# Patient Record
Sex: Female | Born: 1975 | Race: Black or African American | Hispanic: No | Marital: Married | Smoking: Current some day smoker
Health system: Southern US, Community
[De-identification: ages and names within clinical notes are randomized; demographics above are authoritative.]

## PROBLEM LIST (undated history)

## (undated) ENCOUNTER — Emergency Department (HOSPITAL_COMMUNITY): Payer: Managed Care, Other (non HMO)

---

## 2000-06-30 ENCOUNTER — Encounter: Payer: Self-pay | Admitting: Internal Medicine

## 2000-06-30 ENCOUNTER — Emergency Department (HOSPITAL_COMMUNITY): Admission: EM | Admit: 2000-06-30 | Discharge: 2000-06-30 | Payer: Self-pay | Admitting: Internal Medicine

## 2000-11-10 ENCOUNTER — Emergency Department (HOSPITAL_COMMUNITY): Admission: EM | Admit: 2000-11-10 | Discharge: 2000-11-10 | Payer: Self-pay

## 2004-03-03 ENCOUNTER — Emergency Department (HOSPITAL_COMMUNITY): Admission: EM | Admit: 2004-03-03 | Discharge: 2004-03-03 | Payer: Self-pay | Admitting: Emergency Medicine

## 2005-03-25 ENCOUNTER — Emergency Department (HOSPITAL_COMMUNITY): Admission: EM | Admit: 2005-03-25 | Discharge: 2005-03-25 | Payer: Self-pay | Admitting: Emergency Medicine

## 2011-11-29 ENCOUNTER — Emergency Department (HOSPITAL_COMMUNITY)
Admission: EM | Admit: 2011-11-29 | Discharge: 2011-11-29 | Discharge status: Against Medical Advice | Payer: Managed Care, Other (non HMO) | Attending: Emergency Medicine | Admitting: Emergency Medicine

## 2011-11-29 DIAGNOSIS — R350 Frequency of micturition: Secondary | ICD-10-CM | POA: Insufficient documentation

## 2011-11-29 LAB — URINALYSIS, ROUTINE W REFLEX MICROSCOPIC
Bilirubin Urine: NEGATIVE
Glucose, UA: NEGATIVE mg/dL
Ketones, ur: NEGATIVE mg/dL
Leukocytes, UA: NEGATIVE
Nitrite: NEGATIVE
Protein, ur: NEGATIVE mg/dL
Specific Gravity, Urine: 1.007 (ref 1.005–1.030)
Urobilinogen, UA: 0.2 mg/dL (ref 0.0–1.0)
pH: 6 (ref 5.0–8.0)

## 2011-11-29 LAB — URINE MICROSCOPIC-ADD ON

## 2011-11-29 LAB — POCT PREGNANCY, URINE: Preg Test, Ur: NEGATIVE

## 2011-11-29 NOTE — ED Notes (Signed)
Pt in from home with stated frequent urination with discharge x1 week pt also states odor states some lower abd cramping with pain to the back denies n/v/d

## 2012-08-02 ENCOUNTER — Encounter (HOSPITAL_COMMUNITY): Payer: Self-pay | Admitting: Emergency Medicine

## 2012-08-02 ENCOUNTER — Emergency Department (HOSPITAL_COMMUNITY)
Admission: EM | Admit: 2012-08-02 | Discharge: 2012-08-03 | Disposition: A | Discharge status: Home / Self Care | Payer: Managed Care, Other (non HMO) | Attending: Emergency Medicine | Admitting: Emergency Medicine

## 2012-08-02 DIAGNOSIS — N12 Tubulo-interstitial nephritis, not specified as acute or chronic: Secondary | ICD-10-CM

## 2012-08-02 DIAGNOSIS — F17200 Nicotine dependence, unspecified, uncomplicated: Secondary | ICD-10-CM | POA: Insufficient documentation

## 2012-08-02 LAB — URINALYSIS, ROUTINE W REFLEX MICROSCOPIC
Nitrite: NEGATIVE
Specific Gravity, Urine: 1.009 (ref 1.005–1.030)
pH: 6.5 (ref 5.0–8.0)

## 2012-08-02 LAB — CBC WITH DIFFERENTIAL/PLATELET
Basophils Absolute: 0 10*3/uL (ref 0.0–0.1)
HCT: 35.7 % — ABNORMAL LOW (ref 36.0–46.0)
Hemoglobin: 12.3 g/dL (ref 12.0–15.0)
Lymphocytes Relative: 19 % (ref 12–46)
Monocytes Absolute: 0.6 10*3/uL (ref 0.1–1.0)
Monocytes Relative: 9 % (ref 3–12)
Neutro Abs: 4.9 10*3/uL (ref 1.7–7.7)
RDW: 13 % (ref 11.5–15.5)
WBC: 7 10*3/uL (ref 4.0–10.5)

## 2012-08-02 LAB — URINE MICROSCOPIC-ADD ON

## 2012-08-02 MED ORDER — FENTANYL CITRATE 0.05 MG/ML IJ SOLN
50.0000 ug | Freq: Once | INTRAMUSCULAR | Status: AC
  Administered 2012-08-02: 50 ug via INTRAVENOUS
  Filled 2012-08-02: qty 2

## 2012-08-02 MED ORDER — ONDANSETRON HCL 4 MG/2ML IJ SOLN
4.0000 mg | Freq: Once | INTRAMUSCULAR | Status: AC
  Administered 2012-08-02: 4 mg via INTRAVENOUS
  Filled 2012-08-02: qty 2

## 2012-08-02 MED ORDER — SODIUM CHLORIDE 0.9 % IV BOLUS (SEPSIS)
1000.0000 mL | Freq: Once | INTRAVENOUS | Status: AC
  Administered 2012-08-02: 1000 mL via INTRAVENOUS

## 2012-08-02 NOTE — ED Notes (Signed)
Pt c/o lt side pain with burning on urination x3days ago with nausea, no vomiting

## 2012-08-02 NOTE — ED Notes (Signed)
Triage assessment done by Diane,RN

## 2012-08-03 LAB — COMPREHENSIVE METABOLIC PANEL
BUN: 13 mg/dL (ref 6–23)
Calcium: 9.9 mg/dL (ref 8.4–10.5)
Creatinine, Ser: 0.79 mg/dL (ref 0.50–1.10)
GFR calc Af Amer: >90 mL/min (ref >90)
Glucose, Bld: 104 mg/dL — ABNORMAL HIGH (ref 70–99)
Total Protein: 7.3 g/dL (ref 6.0–8.3)

## 2012-08-03 MED ORDER — ONDANSETRON HCL 4 MG/2ML IJ SOLN
4.0000 mg | Freq: Once | INTRAMUSCULAR | Status: AC
  Administered 2012-08-03: 4 mg via INTRAVENOUS

## 2012-08-03 MED ORDER — FENTANYL CITRATE 0.05 MG/ML IJ SOLN
50.0000 ug | Freq: Once | INTRAMUSCULAR | Status: AC
  Administered 2012-08-03: 50 ug via INTRAVENOUS

## 2012-08-03 MED ORDER — ONDANSETRON HCL 4 MG/2ML IJ SOLN
INTRAMUSCULAR | Status: AC
  Administered 2012-08-03: 4 mg via INTRAVENOUS
  Filled 2012-08-03: qty 2

## 2012-08-03 MED ORDER — ONDANSETRON HCL 4 MG PO TABS: 4.0000 mg | ORAL_TABLET | Freq: Four times a day (QID) | ORAL | Status: AC

## 2012-08-03 MED ORDER — CIPROFLOXACIN IN D5W 400 MG/200ML IV SOLN
INTRAVENOUS | Status: AC
  Administered 2012-08-03: 400 mg via INTRAVENOUS
  Filled 2012-08-03: qty 200

## 2012-08-03 MED ORDER — OXYCODONE-ACETAMINOPHEN 5-325 MG PO TABS: 1.0000 | ORAL_TABLET | Freq: Four times a day (QID) | ORAL | Status: AC | PRN

## 2012-08-03 MED ORDER — CIPROFLOXACIN HCL 500 MG PO TABS: 500.0000 mg | ORAL_TABLET | Freq: Two times a day (BID) | ORAL | Status: AC

## 2012-08-03 MED ORDER — FENTANYL CITRATE 0.05 MG/ML IJ SOLN
INTRAMUSCULAR | Status: AC
  Administered 2012-08-03: 50 ug via INTRAVENOUS
  Filled 2012-08-03: qty 2

## 2012-08-03 MED ORDER — CIPROFLOXACIN IN D5W 400 MG/200ML IV SOLN
400.0000 mg | Freq: Once | INTRAVENOUS | Status: AC
  Administered 2012-08-03: 400 mg via INTRAVENOUS

## 2012-08-03 NOTE — ED Provider Notes (Signed)
History     CSN: [id number]  Arrival date & time 08/02/12  2003   First MD Initiated Contact with Patient 08/02/12 2253      Chief Complaint  Patient presents with  . Flank Pain    (Consider location/radiation/quality/duration/timing/severity/associated sxs/prior treatment) HPI  Patient to the ER for evaluation of back pain with burning on urination for 3 days. She has had some nausea without vomiting. No fevers, chills, diarrhea, or hematuria. She rates the pain as a 5/10 and the quality is a "heavy dullness". NAD/VSS  History reviewed. No pertinent past medical history.  History reviewed. No pertinent past surgical history.  No family history on file.  History  Substance Use Topics  . Smoking status: Current Some Day Smoker    Types: Cigars  . Smokeless tobacco: Not on file  . Alcohol Use: Yes    OB History    Grav Para Term Preterm Abortions TAB SAB Ect Mult Living                  Review of Systems   Review of Systems  Gen: no weight loss, fevers, chills, night sweats  Eyes: no discharge or drainage, no occular pain or visual changes  Nose: no epistaxis or rhinorrhea  Mouth: no dental pain, no sore throat  Neck: no neck pain  Lungs:No wheezing, coughing or hemoptysis CV: no chest pain, palpitations, dependent edema or orthopnea  Abd: no abdominal pain, + nausea, - vomiting  GU: + dysuria or gross hematuria  MSK:  No abnormalities  Neuro: no headache, no focal neurologic deficits  Skin: no abnormalities Psyche: negative.    Allergies  Codeine  Home Medications   Current Outpatient Rx  Name Route Sig Dispense Refill  . ADULT MULTIVITAMIN W/MINERALS CH Oral Take 1 tablet by mouth 2 (two) times daily.    Marland Kitchen CIPROFLOXACIN HCL 500 MG PO TABS Oral Take 1 tablet (500 mg total) by mouth 2 (two) times daily. 14 tablet 0  . ONDANSETRON HCL 4 MG PO TABS Oral Take 1 tablet (4 mg total) by mouth every 6 (six) hours. 12 tablet 0  . OXYCODONE-ACETAMINOPHEN  5-325 MG PO TABS Oral Take 1 tablet by mouth every 6 (six) hours as needed for pain. 15 tablet 0    BP 123/89  Pulse 84  Temp 98.4 F (36.9 C)  Resp 18  Wt 115 lb (52.164 kg)  SpO2 100%  LMP 07/24/2012  Physical Exam  Constitutional: She appears well-developed and well-nourished.  HENT:  Head: Normocephalic and atraumatic.  Eyes: Pupils are equal, round, and reactive to light.  Neck: Trachea normal, normal range of motion and full passive range of motion without pain. Neck supple.  Cardiovascular: Normal rate, regular rhythm, normal heart sounds and normal pulses.   Pulmonary/Chest: Effort normal and breath sounds normal. Chest wall is not dull to percussion. She exhibits no crepitus, no edema, no deformity and no retraction.  Abdominal: Normal appearance. There is generalized tenderness (diffuse generalized back pain). There is no rigidity, no rebound, no guarding, no CVA tenderness, no tenderness at McBurney's point and negative Murphy's sign.  Musculoskeletal: Normal range of motion.  Neurological: She is alert. She has normal strength.  Skin: Skin is warm, dry and intact. She is not diaphoretic.  Psychiatric: She has a normal mood and affect. Her speech is normal. Cognition and memory are normal.    ED Course  Procedures (including critical care time)  Labs Reviewed  URINALYSIS, ROUTINE W REFLEX MICROSCOPIC -  Abnormal; Notable for the following:    APPearance CLOUDY (*)     Hgb urine dipstick SMALL (*)     Leukocytes, UA LARGE (*)     All other components within normal limits  URINE MICROSCOPIC-ADD ON - Abnormal; Notable for the following:    Bacteria, UA FEW (*)     All other components within normal limits  COMPREHENSIVE METABOLIC PANEL - Abnormal; Notable for the following:    Glucose, Bld 104 (*)     Total Bilirubin 0.2 (*)     All other components within normal limits  CBC WITH DIFFERENTIAL - Abnormal; Notable for the following:    HCT 35.7 (*)     All other  components within normal limits  PREGNANCY, URINE  URINE CULTURE  LAB REPORT - SCANNED   No results found.   1. Pyelonephritis       MDM  Pt given IV Cipro and pain medication with zofran. Dc with Cipro, pain meds and nausea medication and asked to follow-up with her PCP. The patient feeling much better prior to discharge. Her labs, PE and vitals support that she is well enough for OP treatment.  Pt has been advised of the symptoms that warrant their return to the ED. Patient has voiced understanding and has agreed to follow-up with the PCP or specialist.         Dorthula Matas, PA 08/11/12 [phone]

## 2012-08-06 LAB — URINE CULTURE: Colony Count: 50000

## 2012-08-07 NOTE — ED Notes (Signed)
+  Urine. Patient treated with Cipro. Sensitive to same. Per protocol MD. °

## 2012-08-11 NOTE — ED Provider Notes (Signed)
Medical screening examination/treatment/procedure(s) were performed by non-physician practitioner and as supervising physician I was immediately available for consultation/collaboration.   Richardean Canal, MD 08/11/12 [phone]

## 2013-10-24 ENCOUNTER — Ambulatory Visit: Payer: Managed Care, Other (non HMO) | Admitting: Family Medicine

## 2013-10-24 VITALS — BP 98/60 | HR 70 | Temp 98.6°F | Resp 18 | Ht 64.0 in | Wt 117.0 lb

## 2013-10-24 DIAGNOSIS — R5383 Other fatigue: Secondary | ICD-10-CM

## 2013-10-24 DIAGNOSIS — R001 Bradycardia, unspecified: Secondary | ICD-10-CM

## 2013-10-24 DIAGNOSIS — R5381 Other malaise: Secondary | ICD-10-CM

## 2013-10-24 DIAGNOSIS — R350 Frequency of micturition: Secondary | ICD-10-CM

## 2013-10-24 DIAGNOSIS — I498 Other specified cardiac arrhythmias: Secondary | ICD-10-CM

## 2013-10-24 DIAGNOSIS — R110 Nausea: Secondary | ICD-10-CM

## 2013-10-24 LAB — POCT UA - MICROSCOPIC ONLY
Bacteria, U Microscopic: NEGATIVE
Casts, Ur, LPF, POC: NEGATIVE
Crystals, Ur, HPF, POC: NEGATIVE
Mucus, UA: NEGATIVE

## 2013-10-24 LAB — POCT CBC
Granulocyte percent: 63.2 %G (ref 37–80)
HCT, POC: 44.4 % (ref 37.7–47.9)
Hemoglobin: 13.7 g/dL (ref 12.2–16.2)
POC Granulocyte: 3.6 (ref 2–6.9)
RBC: 4.98 M/uL (ref 4.04–5.48)

## 2013-10-24 LAB — POCT URINALYSIS DIPSTICK
Bilirubin, UA: NEGATIVE
Nitrite, UA: NEGATIVE
pH, UA: 6.5

## 2013-10-24 LAB — POCT URINE PREGNANCY: Preg Test, Ur: NEGATIVE

## 2013-10-24 LAB — GLUCOSE, POCT (MANUAL RESULT ENTRY): POC Glucose: 93 mg/dl (ref 70–99)

## 2013-10-24 MED ORDER — RANITIDINE HCL 150 MG PO TABS: 150.0000 mg | ORAL_TABLET | Freq: Two times a day (BID) | ORAL | Status: DC

## 2013-10-24 MED ORDER — ONDANSETRON 8 MG PO TBDP: 8.0000 mg | ORAL_TABLET | Freq: Three times a day (TID) | ORAL | Status: DC | PRN

## 2013-10-24 NOTE — Progress Notes (Signed)
Subjective:  This chart was scribed for Norberto Sorenson, MD by Ronal Fear, ED Scribe. the patient's care was started at 5:10 PM.  Chief Complaint  Patient presents with  . Nausea    x1 week   . Fatigue  . Urinary Frequency     Patient ID: Amanda Goodman, female    DOB: 04/20/1976, 37 y.o.   MRN: [id number]  HPI HPI Comments: Amanda Goodman is a 37 y.o. female who presents to [facility] complaining of nausea accompanied a sensation of regurgitation into her throat without any actual emesis for the past 4 days. Has also had a little epigastric discomfort. She states that it feels like her food is stuck in her throat. She has also been very fatigued. She denies fever, chills, sweats, CP.  She also complains of increased urination.  Pt has also had nocturia of at least 2-3x a night for the past sev d which is unusual for her.  She also complains of sudden onset diarrhea today that started this morning and lasted just a few hours with just a few loose watery stools.  She denies bloody or tarry stools. She has not been taking any OTC meds/products. Pt has had a decreased appetite over the past few days. When she does eat, she eats normal meals but has been skipping lots of meals.  Nothing that she eats exacerbates her sxs.   Pt is sexually active and not using birth control.  Se denies breast tenderness. Pt's periods have been normal, until the last 2 mos when they were a little delayed and lighter.   Pt's last pap smear was 1x year ago.     History reviewed. No pertinent past medical history. 5:10 PM-Ordered 25 mg benadryl, 20 mg Pepcid and 60 mg prednisone  Current Outpatient Prescriptions on File Prior to Visit  Medication Sig Dispense Refill  . Multiple Vitamin (MULTIVITAMIN WITH MINERALS) TABS Take 1 tablet by mouth 2 (two) times daily.       No current facility-administered medications on file prior to visit.     Review of Systems  Constitutional: Positive for appetite change and  fatigue. Negative for fever, chills, diaphoresis, activity change and unexpected weight change.  HENT: Positive for trouble swallowing. Negative for sore throat and voice change.   Respiratory: Positive for chest tightness. Negative for cough, shortness of breath and wheezing.   Cardiovascular: Negative for chest pain, palpitations and leg swelling.  Gastrointestinal: Positive for nausea, abdominal pain and diarrhea. Negative for vomiting, constipation, blood in stool, abdominal distention and anal bleeding.  Endocrine: Positive for polyuria. Negative for polydipsia and polyphagia.  Genitourinary: Positive for frequency. Negative for dysuria and difficulty urinating.  Musculoskeletal: Negative for back pain.  Skin: Negative for rash.  Allergic/Immunologic: Negative for food allergies and immunocompromised state.  Neurological: Negative for dizziness, seizures, syncope, facial asymmetry, weakness, light-headedness and numbness.  Hematological: Negative for adenopathy.  Psychiatric/Behavioral: Positive for sleep disturbance.      BP 98/60  Pulse 70  Temp(Src) 98.6 F (37 C) (Oral)  Resp 18  Ht 5\' 4"  (1.626 m)  Wt 117 lb (53.071 kg)  BMI 20.07 kg/m2  SpO2 100%  LMP 10/02/2013  Objective:   Physical Exam  Nursing note and vitals reviewed. Constitutional: She is oriented to person, place, and time. She appears well-developed and well-nourished.  HENT:  Head: Normocephalic and atraumatic.  Eyes: Conjunctivae and EOM are normal. Pupils are equal, round, and reactive to light.  Neck: Normal range of  motion. Neck supple. No thyromegaly present.  Cardiovascular: S1 normal, S2 normal and intact distal pulses.  A regularly irregular rhythm present. Frequent extrasystoles are present. Bradycardia present.   No murmur heard. Pulses:      Radial pulses are 2+ on the right side, and 2+ on the left side.  Pulmonary/Chest: Effort normal and breath sounds normal.  Abdominal: Soft. Normal  appearance and bowel sounds are normal. She exhibits no distension and no mass. There is no hepatosplenomegaly. There is no tenderness. There is no rebound, no guarding, no CVA tenderness and negative Murphy's sign. No hernia.  Musculoskeletal: Normal range of motion.  Lymphadenopathy:    She has no cervical adenopathy.  Neurological: She is alert and oriented to person, place, and time.  Skin: Skin is warm and dry.  Psychiatric: She has a normal mood and affect. Her behavior is normal.   EKG: ventricular bigeminy      Results for orders placed in visit on 10/24/13  POCT URINALYSIS DIPSTICK      Result Value Range   Color, UA yellow     Clarity, UA clear     Glucose, UA neg     Bilirubin, UA neg     Ketones, UA neg     Spec Grav, UA <=1.005     Blood, UA trace     pH, UA 6.5     Protein, UA neg     Urobilinogen, UA 0.2     Nitrite, UA neg     Leukocytes, UA Negative    POCT UA - MICROSCOPIC ONLY      Result Value Range   WBC, Ur, HPF, POC neg     RBC, urine, microscopic neg     Bacteria, U Microscopic neg     Mucus, UA neg     Epithelial cells, urine per micros 0-2     Crystals, Ur, HPF, POC neg     Casts, Ur, LPF, POC neg     Yeast, UA neg    POCT CBC      Result Value Range   WBC 5.7  4.6 - 10.2 K/uL   Lymph, poc 1.7  0.6 - 3.4   POC LYMPH PERCENT 29.5  10 - 50 %L   MID (cbc) 0.4  0 - 0.9   POC MID % 7.3  0 - 12 %M   POC Granulocyte 3.6  2 - 6.9   Granulocyte percent 63.2  37 - 80 %G   RBC 4.98  4.04 - 5.48 M/uL   Hemoglobin 13.7  12.2 - 16.2 g/dL   HCT, POC 14.7  82.9 - 47.9 %   MCV 89.1  80 - 97 fL   MCH, POC 27.5  27 - 31.2 pg   MCHC 30.9 (*) 31.8 - 35.4 g/dL   RDW, POC 56.2     Platelet Count, POC 228  142 - 424 K/uL   MPV 9.2  0 - 99.8 fL  GLUCOSE, POCT (MANUAL RESULT ENTRY)      Result Value Range   POC Glucose 93  70 - 99 mg/dl  POCT URINE PREGNANCY      Result Value Range   Preg Test, Ur Negative      Assessment & Plan:   Frequent urination -  Plan: POCT urinalysis dipstick, POCT UA - Microscopic Only, POCT glucose (manual entry), POCT urine pregnancy, Urine culture - UA nml but urine very dilute and pt w/ a h/o pyelonephritis so will send off for  clx to ensure not overlooking infection.  Nausea alone - Plan: POCT urinalysis dipstick, POCT UA - Microscopic Only, POCT CBC, TSH, Comprehensive metabolic panel - try bid ranitidine due to gastritis, reflux and dysphagia sxs. If sxs persist, may also try zofran just for symptomatic relief.  Fatigue - Plan: POCT urinalysis dipstick, POCT UA - Microscopic Only, POCT CBC, POCT glucose (manual entry), POCT urine pregnancy, TSH, Comprehensive metabolic panel, Ambulatory referral to Cardiology  Bradycardia - Plan: EKG 12-Lead, POCT glucose (manual entry), TSH, Comprehensive metabolic panel, Ambulatory referral to Cardiology  Ventricular bigeminy - Plan: Ambulatory referral to Cardiology -  Pt was informed about her abnormal cardiac rhythm of bradycardia with consistent ectopy which may be contributing to her fatigue. She will be given a referral to a cardiologist for more extensive eval inc to r/o any underlying structural cause.   Meds ordered this encounter  Medications  . ranitidine (ZANTAC) 150 MG tablet    Sig: Take 1 tablet (150 mg total) by mouth 2 (two) times daily.    Dispense:  60 tablet    Refill:  0  . ondansetron (ZOFRAN-ODT) 8 MG disintegrating tablet    Sig: Take 1 tablet (8 mg total) by mouth every 8 (eight) hours as needed for nausea.    Dispense:  10 tablet    Refill:  0    I personally performed the services described in this documentation, which was scribed in my presence. The recorded information has been reviewed and considered, and addended by me as needed.  Norberto Sorenson, MD MPH

## 2013-10-25 ENCOUNTER — Encounter: Payer: Self-pay | Admitting: Family Medicine

## 2013-10-25 LAB — TSH: TSH: 2.512 u[IU]/mL (ref 0.350–4.500)

## 2013-10-25 LAB — COMPREHENSIVE METABOLIC PANEL
ALT: 11 U/L (ref 0–35)
AST: 16 U/L (ref 0–37)
BUN: 11 mg/dL (ref 6–23)
Creat: 0.89 mg/dL (ref 0.50–1.10)
Total Bilirubin: 0.7 mg/dL (ref 0.3–1.2)

## 2013-10-25 LAB — URINE CULTURE: Organism ID, Bacteria: NO GROWTH

## 2015-09-20 ENCOUNTER — Ambulatory Visit (INDEPENDENT_AMBULATORY_CARE_PROVIDER_SITE_OTHER): Payer: Managed Care, Other (non HMO) | Admitting: Family Medicine

## 2015-09-20 VITALS — BP 145/93 | HR 69 | Temp 97.9°F | Resp 16 | Ht 65.0 in | Wt 128.0 lb

## 2015-09-20 DIAGNOSIS — R350 Frequency of micturition: Secondary | ICD-10-CM

## 2015-09-20 DIAGNOSIS — N12 Tubulo-interstitial nephritis, not specified as acute or chronic: Secondary | ICD-10-CM | POA: Diagnosis not present

## 2015-09-20 LAB — POCT URINALYSIS DIP (MANUAL ENTRY)
BILIRUBIN UA: NEGATIVE
BILIRUBIN UA: NEGATIVE
GLUCOSE UA: NEGATIVE
Nitrite, UA: NEGATIVE
SPEC GRAV UA: 1.015
Urobilinogen, UA: 0.2
pH, UA: 8.5

## 2015-09-20 LAB — POC MICROSCOPIC URINALYSIS (UMFC)

## 2015-09-20 LAB — POCT URINE PREGNANCY: Preg Test, Ur: NEGATIVE

## 2015-09-20 MED ORDER — CIPROFLOXACIN HCL 500 MG PO TABS: 500.0000 mg | ORAL_TABLET | Freq: Two times a day (BID) | ORAL | Status: AC

## 2015-09-20 NOTE — Patient Instructions (Signed)

## 2015-09-20 NOTE — Progress Notes (Signed)
[facility] [address], [place] [address] [phone]  Date:  09/20/2015   Name:  Amanda Goodman   DOB:  [birth year]   MRN:  [id number]  PCP:  No primary care provider on file.    History of Present Illness:  Amanda Goodman is a 39 y.o. female patient who presents to [facility] for cc of right flank pain and urinary frequency since yesterday.  Sxs began with urinary frequency, then she noticed a right sided burning sensation along the flank.  She has no nausea vomiting, or lower abdominal pain.  She is returning within days of her honeymoon.  Sexually active without condoms or other birth control.  She has done nothing for relief.   She denies diarrhea or constipation.   No abnormal vaginal discharge, odor, or rash. No hydrating well.   There are no active problems to display for this patient.   No past medical history on file.  No past surgical history on file.  Social History  Substance Use Topics  . Smoking status: Former Smoker    Types: Cigars  . Smokeless tobacco: None  . Alcohol Use: Yes    Family History  Problem Relation Age of Onset  . Diabetes Mother   . Hyperlipidemia Mother   . Hypertension Mother   . Hypertension Sister     Allergies  Allergen Reactions  . Codeine     Medication list has been reviewed and updated.  Current Outpatient Prescriptions on File Prior to Visit  Medication Sig Dispense Refill  . Multiple Vitamin (MULTIVITAMIN WITH MINERALS) TABS Take 1 tablet by mouth 2 (two) times daily.     No current facility-administered medications on file prior to visit.    ROS ROS is otherwise unremarkable unless listed above.  Physical Examination: BP 145/93 mmHg  Pulse 69  Temp(Src) 97.9 F (36.6 C)  Resp 16  Ht  (1.651 m)  Wt 128 lb (58.06 kg)  BMI 21.30 kg/m2  LMP 08/29/2015 Ideal Body Weight: Weight in (lb) to have BMI = 25: 149.9  Physical Exam  Constitutional: She is oriented to person,  place, and time. She appears well-developed and well-nourished. No distress.  HENT:  Head: Normocephalic and atraumatic.  Right Ear: External ear normal.  Left Ear: External ear normal.  Eyes: Conjunctivae and EOM are normal. Pupils are equal, round, and reactive to light.  Cardiovascular: Normal rate.   Pulmonary/Chest: Effort normal. No respiratory distress.  Abdominal: Soft. Normal appearance and bowel sounds are normal. There is no hepatosplenomegaly. There is no tenderness. There is CVA tenderness.  Neurological: She is alert and oriented to person, place, and time.  Skin: She is not diaphoretic.  Psychiatric: She has a normal mood and affect. Her behavior is normal.     Results for orders placed or performed in visit on 09/20/15  POCT urinalysis dipstick  Result Value Ref Range   Color, UA yellow yellow   Clarity, UA clear clear   Glucose, UA negative negative   Bilirubin, UA negative negative   Ketones, POC UA negative negative   Spec Grav, UA 1.015    Blood, UA moderate (A) negative   pH, UA 8.5    Protein Ur, POC =30 (A) negative   Urobilinogen, UA 0.2    Nitrite, UA Negative Negative   Leukocytes, UA moderate (2+) (A) Negative  POCT Microscopic Urinalysis ([facility])  Result Value Ref Range   WBC,UR,HPF,POC Many (A) None WBC/hpf   RBC,UR,HPF,POC  Many (A) None RBC/hpf   Bacteria Moderate (A) None, Too numerous to count   Mucus Present (A) Absent   Epithelial Cells, UR Per Microscopy Few (A) None, Too numerous to count cells/hpf     Assessment and Plan: Sabino GasserChristina L Goodman is a 39 y.o. female who is here today for urinary frequency and right flank pain.  Will treat for pyelonephritis at this time.  Urine culture placed.  Alarming sxs given.    Pyelonephritis - Plan: ciprofloxacin (CIPRO) 500 MG tablet  Urinary frequency - Plan: POCT urinalysis dipstick, POCT Microscopic Urinalysis ([facility]), Urine culture, POCT urine pregnancy, ciprofloxacin (CIPRO) 500 MG  tablet  Trena PlattStephanie English, PA-C [facility] 10/28/201610:21 AM

## 2015-09-22 LAB — URINE CULTURE: Colony Count: >=100000

## 2015-10-03 MED ORDER — FLUCONAZOLE 150 MG PO TABS: 150.0000 mg | ORAL_TABLET | Freq: Once | ORAL | Status: AC

## 2015-10-26 NOTE — Progress Notes (Signed)
Patient ID: Amanda GasserChristina L Goodman, female   DOB: 01/04/1976, 39 y.o.   MRN: [id number] Reviewed documentation and agree w/ assessment and plan. Norberto SorensonEva Elgie Maziarz, MD MPH

## 2018-04-03 ENCOUNTER — Emergency Department (HOSPITAL_BASED_OUTPATIENT_CLINIC_OR_DEPARTMENT_OTHER)
Admission: EM | Admit: 2018-04-03 | Discharge: 2018-04-03 | Disposition: A | Discharge status: Home / Self Care | Payer: Managed Care, Other (non HMO) | Attending: Emergency Medicine | Admitting: Emergency Medicine

## 2018-04-03 ENCOUNTER — Other Ambulatory Visit: Payer: Self-pay

## 2018-04-03 ENCOUNTER — Encounter (HOSPITAL_BASED_OUTPATIENT_CLINIC_OR_DEPARTMENT_OTHER): Payer: Self-pay | Admitting: Emergency Medicine

## 2018-04-03 DIAGNOSIS — R067 Sneezing: Secondary | ICD-10-CM | POA: Diagnosis not present

## 2018-04-03 DIAGNOSIS — F17200 Nicotine dependence, unspecified, uncomplicated: Secondary | ICD-10-CM | POA: Insufficient documentation

## 2018-04-03 DIAGNOSIS — H6693 Otitis media, unspecified, bilateral: Secondary | ICD-10-CM | POA: Diagnosis not present

## 2018-04-03 DIAGNOSIS — H9203 Otalgia, bilateral: Secondary | ICD-10-CM | POA: Diagnosis present

## 2018-04-03 DIAGNOSIS — H6690 Otitis media, unspecified, unspecified ear: Secondary | ICD-10-CM

## 2018-04-03 DIAGNOSIS — R0981 Nasal congestion: Secondary | ICD-10-CM | POA: Diagnosis not present

## 2018-04-03 DIAGNOSIS — H5789 Other specified disorders of eye and adnexa: Secondary | ICD-10-CM | POA: Insufficient documentation

## 2018-04-03 MED ORDER — AMOXICILLIN-POT CLAVULANATE 875-125 MG PO TABS: 1.0000 | ORAL_TABLET | Freq: Two times a day (BID) | ORAL | 0 refills | Status: AC

## 2018-04-03 NOTE — ED Provider Notes (Signed)
[facility] EMERGENCY DEPARTMENT Provider Note   CSN: [id number] Arrival date & time: 04/03/18  1813     History   Chief Complaint Chief Complaint  Patient presents with  . Otalgia    HPI Amanda Goodman is a 42 y.o. female.  The history is provided by the patient and medical records. No language interpreter was used.  Otalgia  Pertinent negatives include no sore throat, no abdominal pain, no vomiting and no cough.   Amanda Goodman is an otherwise healthy 42 y.o. female who presents to the Emergency Department complaining of bilateral ear pain which began yesterday.  Patient states the pollen has been bothering her a great deal of the last several days.  She has had nasal congestion and itchy eyes.  Last night, she noticed right ear pain.  This morning her right ear was still hurting, but not as severe.  Her left ear began throbbing this afternoon, prompting her to seek care.  She denies any fever or chills.  No cough, neck pain.  She did put oil in her ear with a cotton ball.  This did not provide any relief.  She has been taking Zyrtec for the last 2 days to help with her allergies.   History reviewed. No pertinent past medical history.  There are no active problems to display for this patient.   History reviewed. No pertinent surgical history.   OB History   None      Home Medications    Prior to Admission medications   Medication Sig Start Date End Date Taking? Authorizing Provider  amoxicillin-clavulanate (AUGMENTIN) 875-125 MG tablet Take 1 tablet by mouth every 12 (twelve) hours. 04/03/18   Briann Sarchet, Chase Picket, PA-C    Family History No family history on file.  Social History Social History   Tobacco Use  . Smoking status: Current Some Day Smoker  . Smokeless tobacco: Never Used  Substance Use Topics  . Alcohol use: Yes    Comment: occ  . Drug use: Never     Allergies   Codeine   Review of Systems Review of Systems    Constitutional: Negative for chills and fever.  HENT: Positive for congestion, ear pain and sneezing. Negative for sore throat.   Respiratory: Negative for cough and shortness of breath.   Cardiovascular: Negative for chest pain.  Gastrointestinal: Negative for abdominal pain, nausea and vomiting.     Physical Exam Updated Vital Signs BP (!) 156/107 (BP Location: Left Arm)   Pulse 72   Temp 98.8 F (37.1 C) (Oral)   Resp 18   Ht  (1.626 m)   Wt 54.4 kg (120 lb)   LMP 03/07/2018   SpO2 100%   BMI 20.60 kg/m   Physical Exam  Constitutional: She appears well-developed and well-nourished. No distress.  HENT:  Head: Normocephalic and atraumatic.  Bilateral TM's erythematous and bulging.  No mastoid tenderness.  No tragal tenderness.  Canal normal.  Eyes: Pupils are equal, round, and reactive to light. Conjunctivae and EOM are normal. Right eye exhibits no discharge. Left eye exhibits no discharge.  Neck: Neck supple.  Cardiovascular: Normal rate, regular rhythm and normal heart sounds.  No murmur heard. Pulmonary/Chest: Effort normal and breath sounds normal. No respiratory distress. She has no wheezes. She has no rales.  Musculoskeletal: Normal range of motion.  Neurological: She is alert.  Skin: Skin is warm and dry.  Nursing note and vitals reviewed.    ED Treatments / Results  Labs (  all labs ordered are listed, but only abnormal results are displayed) Labs Reviewed - No data to display  EKG None  Radiology No results found.  Procedures Procedures (including critical care time)  Medications Ordered in ED Medications - No data to display   Initial Impression / Assessment and Plan / ED Course  I have reviewed the triage vital signs and the nursing notes.  Pertinent labs & imaging results that were available during my care of the patient were reviewed by me and considered in my medical decision making (see chart for details).    Amanda Goodman is a  42 y.o. female who presents to ED for bilateral ear pain since yesterday.  She has had a URI/allergy symptoms for several days previously.  On exam, patient is afebrile, hemodynamically stable with clear lung exam.  Ear exam consistent with otitis media.  No mastoid tenderness to suggest mastoiditis.  Will treat with Augmentin and have her follow-up with PCP.  Encouraged Claritin or Zyrtec daily. Reasons to return to ER discussed and all questions answered.   Final Clinical Impressions(s) / ED Diagnoses   Final diagnoses:  Acute otitis media, unspecified otitis media type    ED Discharge Orders        Ordered    amoxicillin-clavulanate (AUGMENTIN) 875-125 MG tablet  Every 12 hours     04/03/18 1924       Kael Keetch, Chase Picket, PA-C 04/03/18 1930    Benjiman Core, MD 04/03/18 2358

## 2018-04-03 NOTE — ED Triage Notes (Signed)
Patient states that she has had bilateral ear pain since last night .

## 2018-04-03 NOTE — Discharge Instructions (Signed)
Follow up with your doctor in 7 days to make sure the treatment has worked. If there is no improvement in 2-3 days, please follow up sooner. Alternate between tylenol and ibuprofen as needed for pain.    GET HELP RIGHT AWAY IF:  You have a temperature by mouth above 102 F (38.9 C) There is ear pain after 3 days.  You develops a allergic reaction, itchy rash, loss of hearing, new or worsening symptoms develop or you have any additional concerns.

## 2018-04-03 NOTE — ED Notes (Signed)
ED Provider at bedside. 

## 2019-05-30 DIAGNOSIS — R634 Abnormal weight loss: Secondary | ICD-10-CM | POA: Diagnosis not present

## 2019-05-30 DIAGNOSIS — Z0000 Encounter for general adult medical examination without abnormal findings: Secondary | ICD-10-CM | POA: Diagnosis not present

## 2019-05-30 DIAGNOSIS — Z23 Encounter for immunization: Secondary | ICD-10-CM | POA: Diagnosis not present

## 2019-05-30 DIAGNOSIS — Z8249 Family history of ischemic heart disease and other diseases of the circulatory system: Secondary | ICD-10-CM | POA: Diagnosis not present

## 2019-06-05 ENCOUNTER — Other Ambulatory Visit: Payer: Self-pay | Admitting: Family Medicine

## 2019-06-05 DIAGNOSIS — Z1231 Encounter for screening mammogram for malignant neoplasm of breast: Secondary | ICD-10-CM

## 2019-07-19 ENCOUNTER — Ambulatory Visit: Payer: Managed Care, Other (non HMO)

## 2019-08-31 ENCOUNTER — Other Ambulatory Visit: Payer: Self-pay

## 2019-08-31 ENCOUNTER — Ambulatory Visit
Admission: RE | Admit: 2019-08-31 | Discharge: 2019-08-31 | Disposition: A | Discharge status: Home / Self Care | Payer: BC Managed Care – PPO | Source: Ambulatory Visit | Attending: Family Medicine | Admitting: Family Medicine

## 2019-08-31 DIAGNOSIS — Z1231 Encounter for screening mammogram for malignant neoplasm of breast: Secondary | ICD-10-CM

## 2020-06-13 DIAGNOSIS — Z131 Encounter for screening for diabetes mellitus: Secondary | ICD-10-CM | POA: Diagnosis not present

## 2020-06-13 DIAGNOSIS — Z13220 Encounter for screening for lipoid disorders: Secondary | ICD-10-CM | POA: Diagnosis not present

## 2020-06-13 DIAGNOSIS — Z0000 Encounter for general adult medical examination without abnormal findings: Secondary | ICD-10-CM | POA: Diagnosis not present

## 2020-12-17 ENCOUNTER — Other Ambulatory Visit: Payer: Self-pay | Admitting: Family Medicine

## 2020-12-17 DIAGNOSIS — Z1231 Encounter for screening mammogram for malignant neoplasm of breast: Secondary | ICD-10-CM

## 2021-01-30 ENCOUNTER — Other Ambulatory Visit: Payer: Self-pay

## 2021-01-30 ENCOUNTER — Ambulatory Visit
Admission: RE | Admit: 2021-01-30 | Discharge: 2021-01-30 | Disposition: A | Discharge status: Home / Self Care | Payer: BC Managed Care – PPO | Source: Ambulatory Visit | Attending: Family Medicine | Admitting: Family Medicine

## 2021-01-30 DIAGNOSIS — Z1231 Encounter for screening mammogram for malignant neoplasm of breast: Secondary | ICD-10-CM

## 2021-07-04 DIAGNOSIS — R030 Elevated blood-pressure reading, without diagnosis of hypertension: Secondary | ICD-10-CM | POA: Diagnosis not present

## 2021-07-04 DIAGNOSIS — Z13220 Encounter for screening for lipoid disorders: Secondary | ICD-10-CM | POA: Diagnosis not present

## 2021-07-04 DIAGNOSIS — Z0000 Encounter for general adult medical examination without abnormal findings: Secondary | ICD-10-CM | POA: Diagnosis not present

## 2021-07-04 DIAGNOSIS — Z124 Encounter for screening for malignant neoplasm of cervix: Secondary | ICD-10-CM | POA: Diagnosis not present

## 2021-10-12 IMAGING — MG MM DIGITAL SCREENING BILAT W/ TOMO AND CAD
8 series · 9 of 24 positions shown · non-contrast
Comparison: Previous exam(s).

CLINICAL DATA: Screening.

EXAM:
DIGITAL SCREENING BILATERAL MAMMOGRAM WITH TOMOSYNTHESIS AND CAD
TECHNIQUE: Bilateral screening digital craniocaudal and mediolateral oblique
mammograms were obtained. Bilateral screening digital breast
tomosynthesis was performed. The images were evaluated with
computer-aided detection.

[L MLO synth-2D]
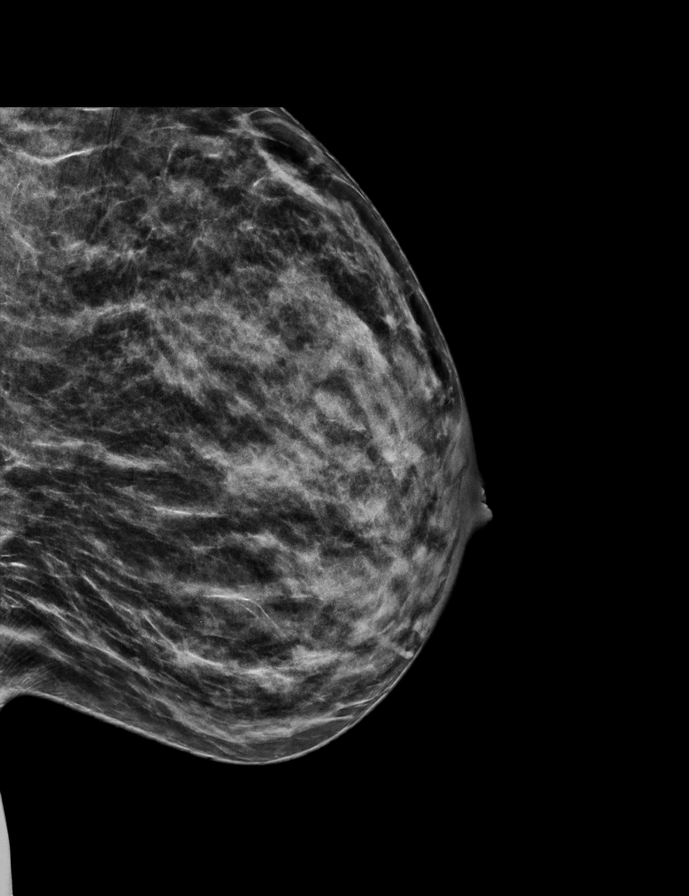

[R CC synth-2D]
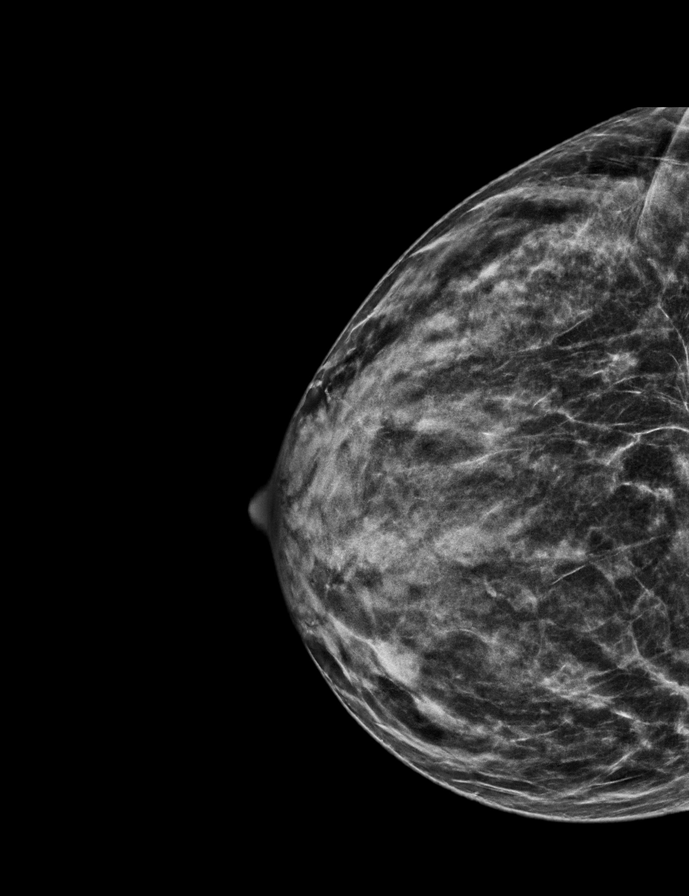

[R MLO synth-2D]
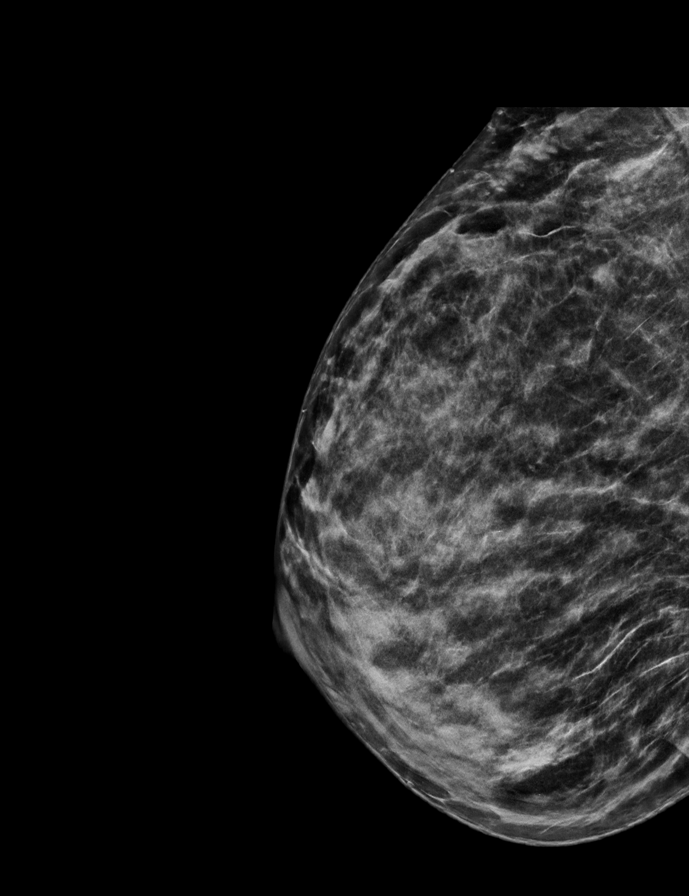

[L CC synth-2D]
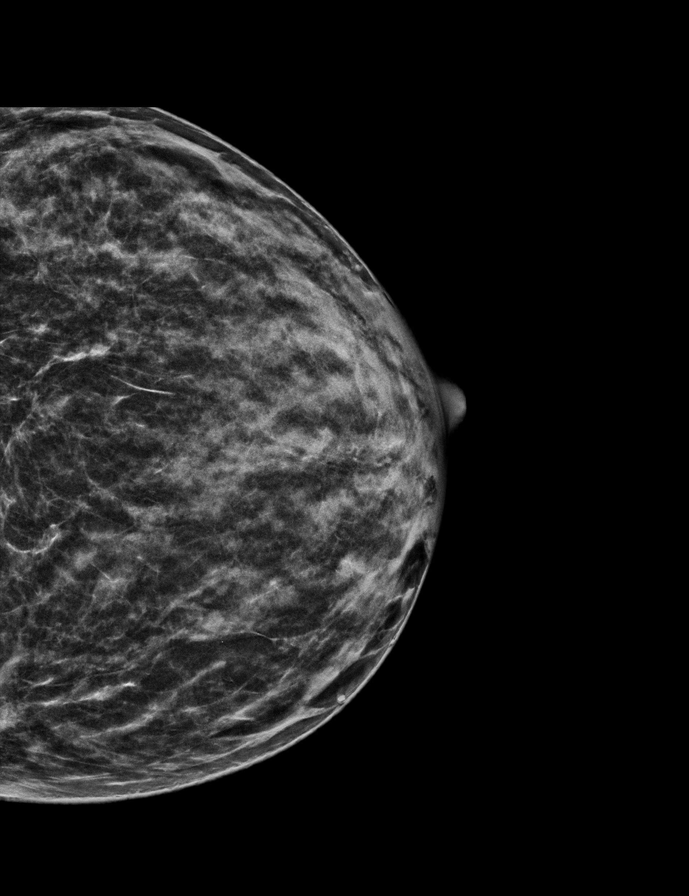

[R MLO tomo · 2 of 52 frames shown]
[frame 17/52]
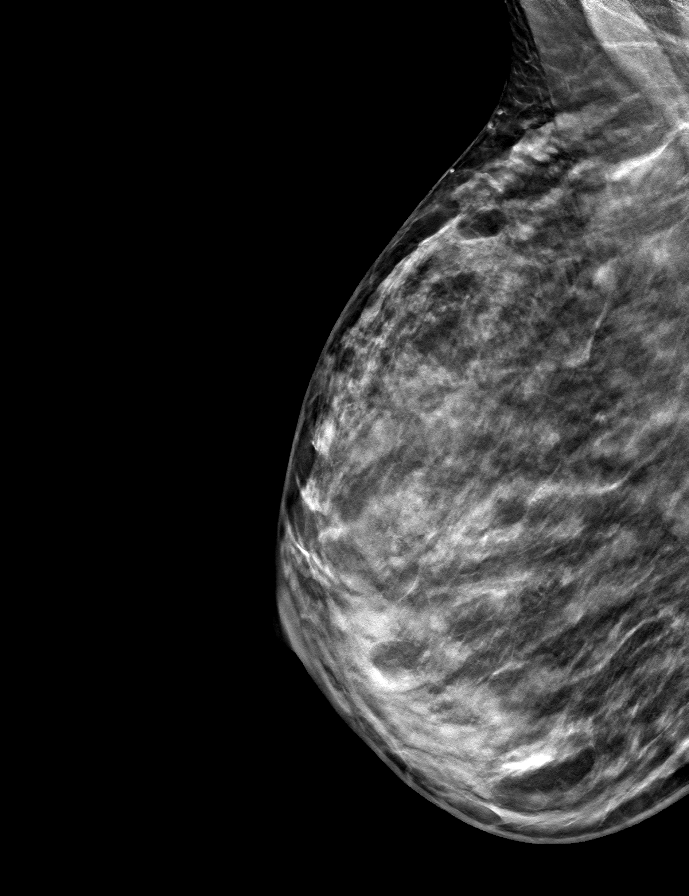
[frame 27/52]
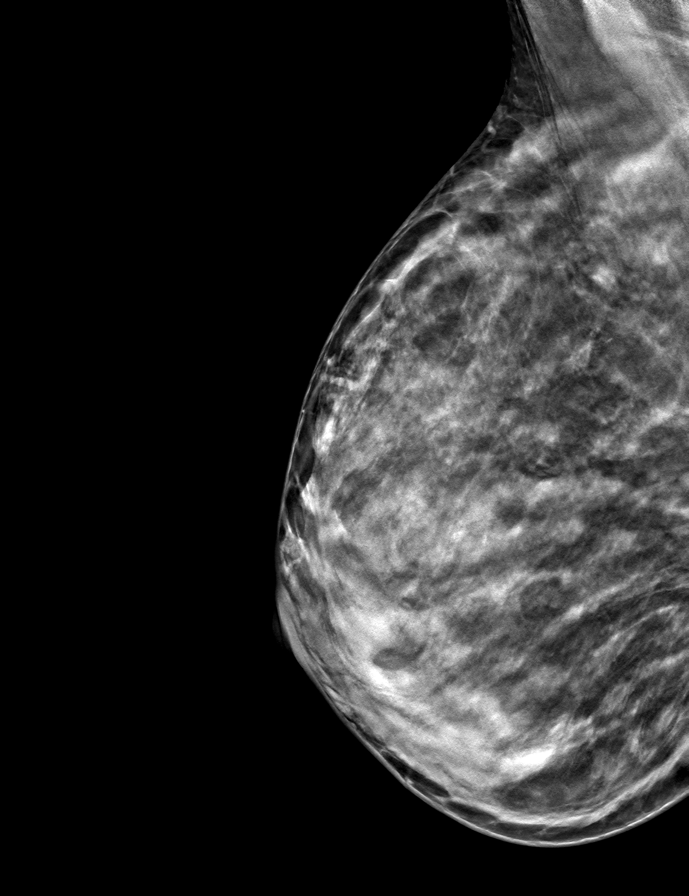

[R CC tomo · tomo slice 27/53.0]
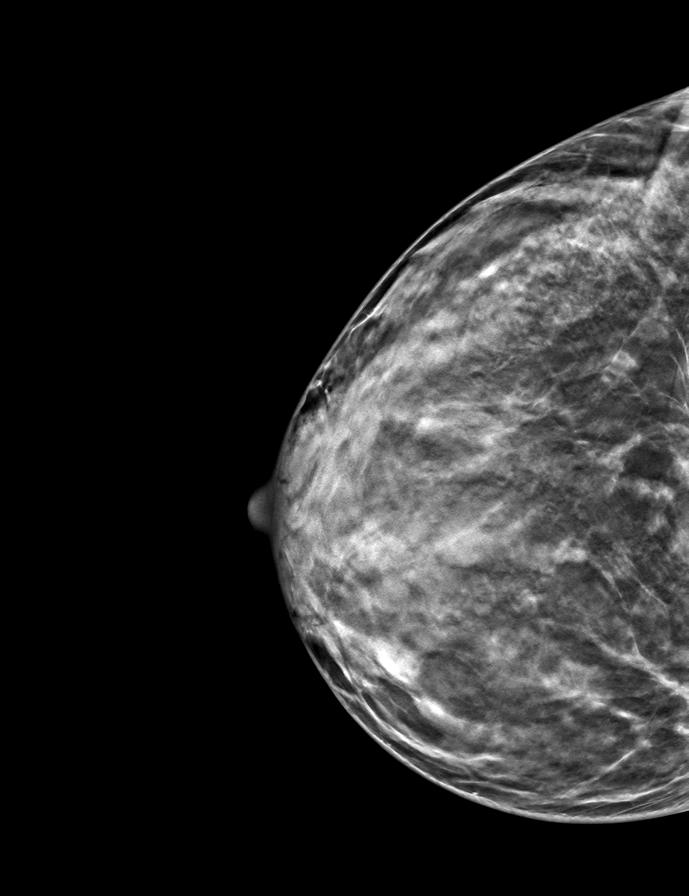

[L MLO tomo · tomo slice 27/54.0]
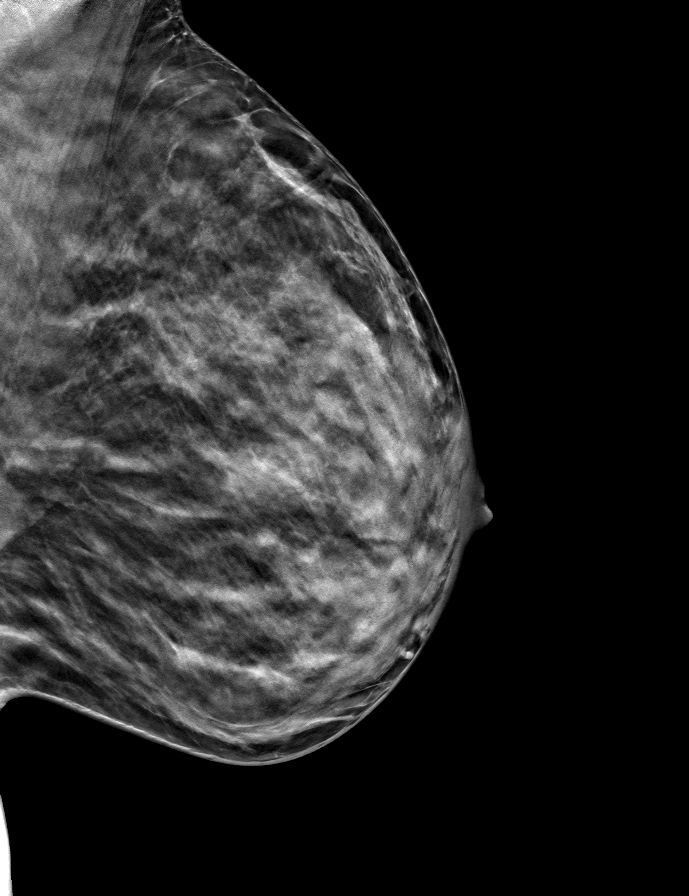

[L CC tomo · tomo slice 24/47.0]
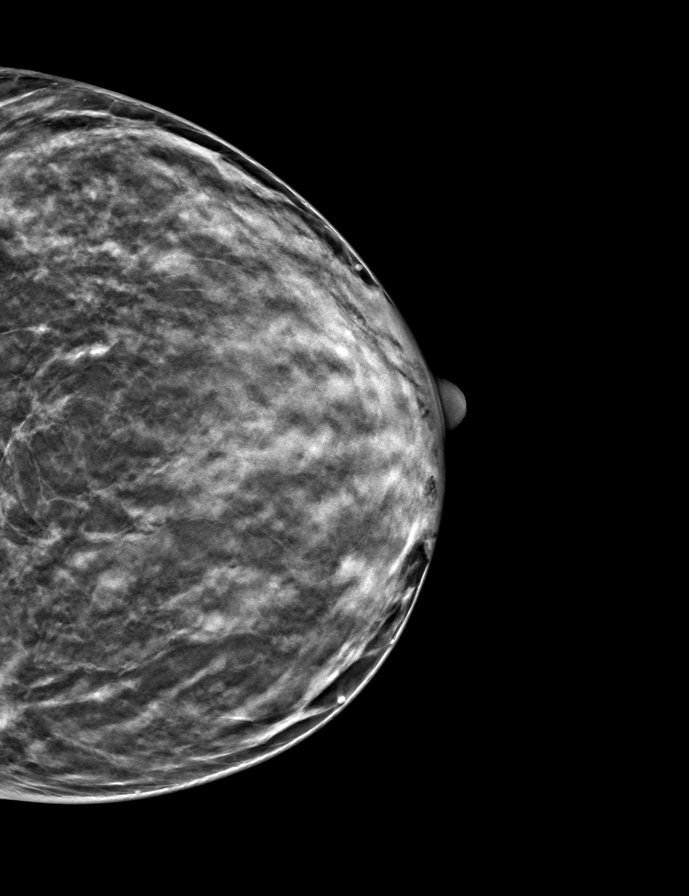

[9 of 24 positions shown; findings below may reference images not displayed]

ACR Breast Density Category c: The breast tissue is heterogeneously
dense, which may obscure small masses.
FINDINGS: There are no findings suspicious for malignancy. The images were
evaluated with computer-aided detection.
IMPRESSION: No mammographic evidence of malignancy. A result letter of this
screening mammogram will be mailed directly to the patient.

RECOMMENDATION:
Screening mammogram in one year. (Code:T4-5-GWO)

BI-RADS CATEGORY  1: Negative.

## 2022-07-09 DIAGNOSIS — E785 Hyperlipidemia, unspecified: Secondary | ICD-10-CM | POA: Diagnosis not present

## 2022-07-09 DIAGNOSIS — Z131 Encounter for screening for diabetes mellitus: Secondary | ICD-10-CM | POA: Diagnosis not present

## 2022-07-09 DIAGNOSIS — Z0000 Encounter for general adult medical examination without abnormal findings: Secondary | ICD-10-CM | POA: Diagnosis not present

## 2022-07-13 DIAGNOSIS — Z1211 Encounter for screening for malignant neoplasm of colon: Secondary | ICD-10-CM | POA: Diagnosis not present

## 2022-07-21 ENCOUNTER — Other Ambulatory Visit: Payer: Self-pay | Admitting: Orthopaedic Surgery

## 2022-07-21 DIAGNOSIS — Z1231 Encounter for screening mammogram for malignant neoplasm of breast: Secondary | ICD-10-CM

## 2022-07-22 ENCOUNTER — Ambulatory Visit
Admission: RE | Admit: 2022-07-22 | Discharge: 2022-07-22 | Disposition: A | Discharge status: Home / Self Care | Payer: BC Managed Care – PPO | Source: Ambulatory Visit | Attending: Orthopaedic Surgery | Admitting: Orthopaedic Surgery

## 2022-07-22 DIAGNOSIS — Z1231 Encounter for screening mammogram for malignant neoplasm of breast: Secondary | ICD-10-CM

## 2023-07-30 DIAGNOSIS — E785 Hyperlipidemia, unspecified: Secondary | ICD-10-CM | POA: Diagnosis not present

## 2023-07-30 DIAGNOSIS — Z0000 Encounter for general adult medical examination without abnormal findings: Secondary | ICD-10-CM | POA: Diagnosis not present

## 2023-07-30 DIAGNOSIS — Z131 Encounter for screening for diabetes mellitus: Secondary | ICD-10-CM | POA: Diagnosis not present

## 2023-07-30 DIAGNOSIS — Z23 Encounter for immunization: Secondary | ICD-10-CM | POA: Diagnosis not present

## 2024-07-31 ENCOUNTER — Other Ambulatory Visit: Payer: Self-pay | Admitting: Family Medicine

## 2024-07-31 DIAGNOSIS — Z136 Encounter for screening for cardiovascular disorders: Secondary | ICD-10-CM | POA: Diagnosis not present

## 2024-07-31 DIAGNOSIS — Z0000 Encounter for general adult medical examination without abnormal findings: Secondary | ICD-10-CM | POA: Diagnosis not present

## 2024-07-31 DIAGNOSIS — Z1231 Encounter for screening mammogram for malignant neoplasm of breast: Secondary | ICD-10-CM

## 2024-07-31 DIAGNOSIS — Z23 Encounter for immunization: Secondary | ICD-10-CM | POA: Diagnosis not present

## 2024-07-31 DIAGNOSIS — E782 Mixed hyperlipidemia: Secondary | ICD-10-CM | POA: Diagnosis not present

## 2024-08-30 ENCOUNTER — Ambulatory Visit
Admission: RE | Admit: 2024-08-30 | Discharge: 2024-08-30 | Disposition: A | Discharge status: Home / Self Care | Source: Ambulatory Visit | Attending: Family Medicine | Admitting: Family Medicine

## 2024-08-30 DIAGNOSIS — Z1231 Encounter for screening mammogram for malignant neoplasm of breast: Secondary | ICD-10-CM | POA: Diagnosis not present

## 2024-09-25 DIAGNOSIS — Z1211 Encounter for screening for malignant neoplasm of colon: Secondary | ICD-10-CM | POA: Diagnosis not present

## 2024-09-25 DIAGNOSIS — K640 First degree hemorrhoids: Secondary | ICD-10-CM | POA: Diagnosis not present
